# Patient Record
Sex: Female | Born: 1955 | Race: White | Hispanic: No | Smoking: Never smoker
Health system: Southern US, Community
[De-identification: ages and names within clinical notes are randomized; demographics above are authoritative.]

---

## 2014-01-25 ENCOUNTER — Emergency Department
Admission: EM | Admit: 2014-01-25 | Discharge: 2014-01-25 | Disposition: A | Discharge status: Home / Self Care | Payer: BC Managed Care – PPO | Source: Home / Self Care

## 2014-01-25 ENCOUNTER — Emergency Department (INDEPENDENT_AMBULATORY_CARE_PROVIDER_SITE_OTHER): Payer: BC Managed Care – PPO

## 2014-01-25 DIAGNOSIS — M549 Dorsalgia, unspecified: Secondary | ICD-10-CM

## 2014-01-25 MED ORDER — MELOXICAM 15 MG PO TABS: 15.0000 mg | ORAL_TABLET | Freq: Every day | ORAL | Status: AC

## 2014-01-25 MED ORDER — CYCLOBENZAPRINE HCL 10 MG PO TABS: 10.0000 mg | ORAL_TABLET | Freq: Three times a day (TID) | ORAL | Status: AC | PRN

## 2014-01-25 MED ORDER — METHYLPREDNISOLONE SODIUM SUCC 125 MG IJ SOLR
125.0000 mg | Freq: Once | INTRAMUSCULAR | Status: AC
  Administered 2014-01-25: 125 mg via INTRAMUSCULAR

## 2014-01-25 NOTE — ED Provider Notes (Signed)
CSN: [id number]     Arrival date & time 01/25/14  1218 History   None    No chief complaint on file.   HPI The patient presents today with back pain. Location: R mid/lower back  Timing: 1-2 weeks.  Description: R back pain. Had strain from back 1-2 weeks ago. Was doing cleaning and had strain recurrence  Worse with: bending, prolonged sitting  Better with: rest, standing  Trauma: no Bladder/bowel incontinence: no Weakness: no Fever/chills: no Night pain:no Unexplained weight loss: no Cancer/immunosuppression: no PMH of osteoporosis or chronic steroid use:  no No past medical history on file. No past surgical history on file. No family history on file. History  Substance Use Topics  . Smoking status: Not on file  . Smokeless tobacco: Not on file  . Alcohol Use: Not on file   OB History    No data available     Review of Systems  All other systems reviewed and are negative.   Allergies  Review of patient's allergies indicates not on file.  Home Medications   Prior to Admission medications   Medication Sig Start Date End Date Taking? Authorizing Provider  cyclobenzaprine (FLEXERIL) 10 MG tablet Take 1 tablet (10 mg total) by mouth 3 (three) times daily as needed for muscle spasms. 01/25/14   Doree AlbeeSteven Norah Fick, MD  meloxicam (MOBIC) 15 MG tablet Take 1 tablet (15 mg total) by mouth daily. 01/25/14   Doree AlbeeSteven Lavanna Rog, MD   There were no vitals taken for this visit. Physical Exam  Constitutional: She appears well-developed and well-nourished.  HENT:  Head: Normocephalic and atraumatic.  Eyes: Pupils are equal, round, and reactive to light.  Neck: Normal range of motion.  Cardiovascular: Normal rate and regular rhythm.   Pulmonary/Chest: Effort normal.  Abdominal: Soft.  Musculoskeletal:       Arms: + pain with flexion/extension over affected area      ED Course  Procedures (including critical care time) Labs Review Labs Reviewed - No data to display  Imaging  Review Dg Ribs Unilateral W/chest Left  01/25/2014   CLINICAL DATA:  Back pain.  EXAM: LEFT RIBS AND CHEST - 3+ VIEW  COMPARISON:  None.  FINDINGS: Note that dedicated rib images were obtained of the left lower ribs with a marker placed below the costal margin.No fracture or other bone lesions are seen involving the ribs. Normal heart size and mediastinal contours. No acute infiltrate or edema. No effusion or pneumothorax.  IMPRESSION: Negative.   Electronically Signed   By: Tiburcio PeaJonathan  Watts M.D.   On: 01/25/2014 13:28     MDM   1. Back pain    Likely MSK strain Solumedrol 125mg  IM x1 Rx for mobic and flexeril RICE and NSAIDs  Discussed general care and MSK red flags Follow up as needed.     The patient and/or caregiver has been counseled thoroughly with regard to treatment plan and/or medications prescribed including dosage, schedule, interactions, rationale for use, and possible side effects and they verbalize understanding. Diagnoses and expected course of recovery discussed and will return if not improved as expected or if the condition worsens. Patient and/or caregiver verbalized understanding.         Doree AlbeeSteven Chares Slaymaker, MD 01/25/14 [phone]

## 2016-04-02 IMAGING — CR DG RIBS W/ CHEST 3+V*L*
3 series · 3 of 3 positions shown · non-contrast
Comparison: None.

CLINICAL DATA: Back pain.

EXAM:
LEFT RIBS AND CHEST - 3+ VIEW

[view not recorded (1 of 3)]
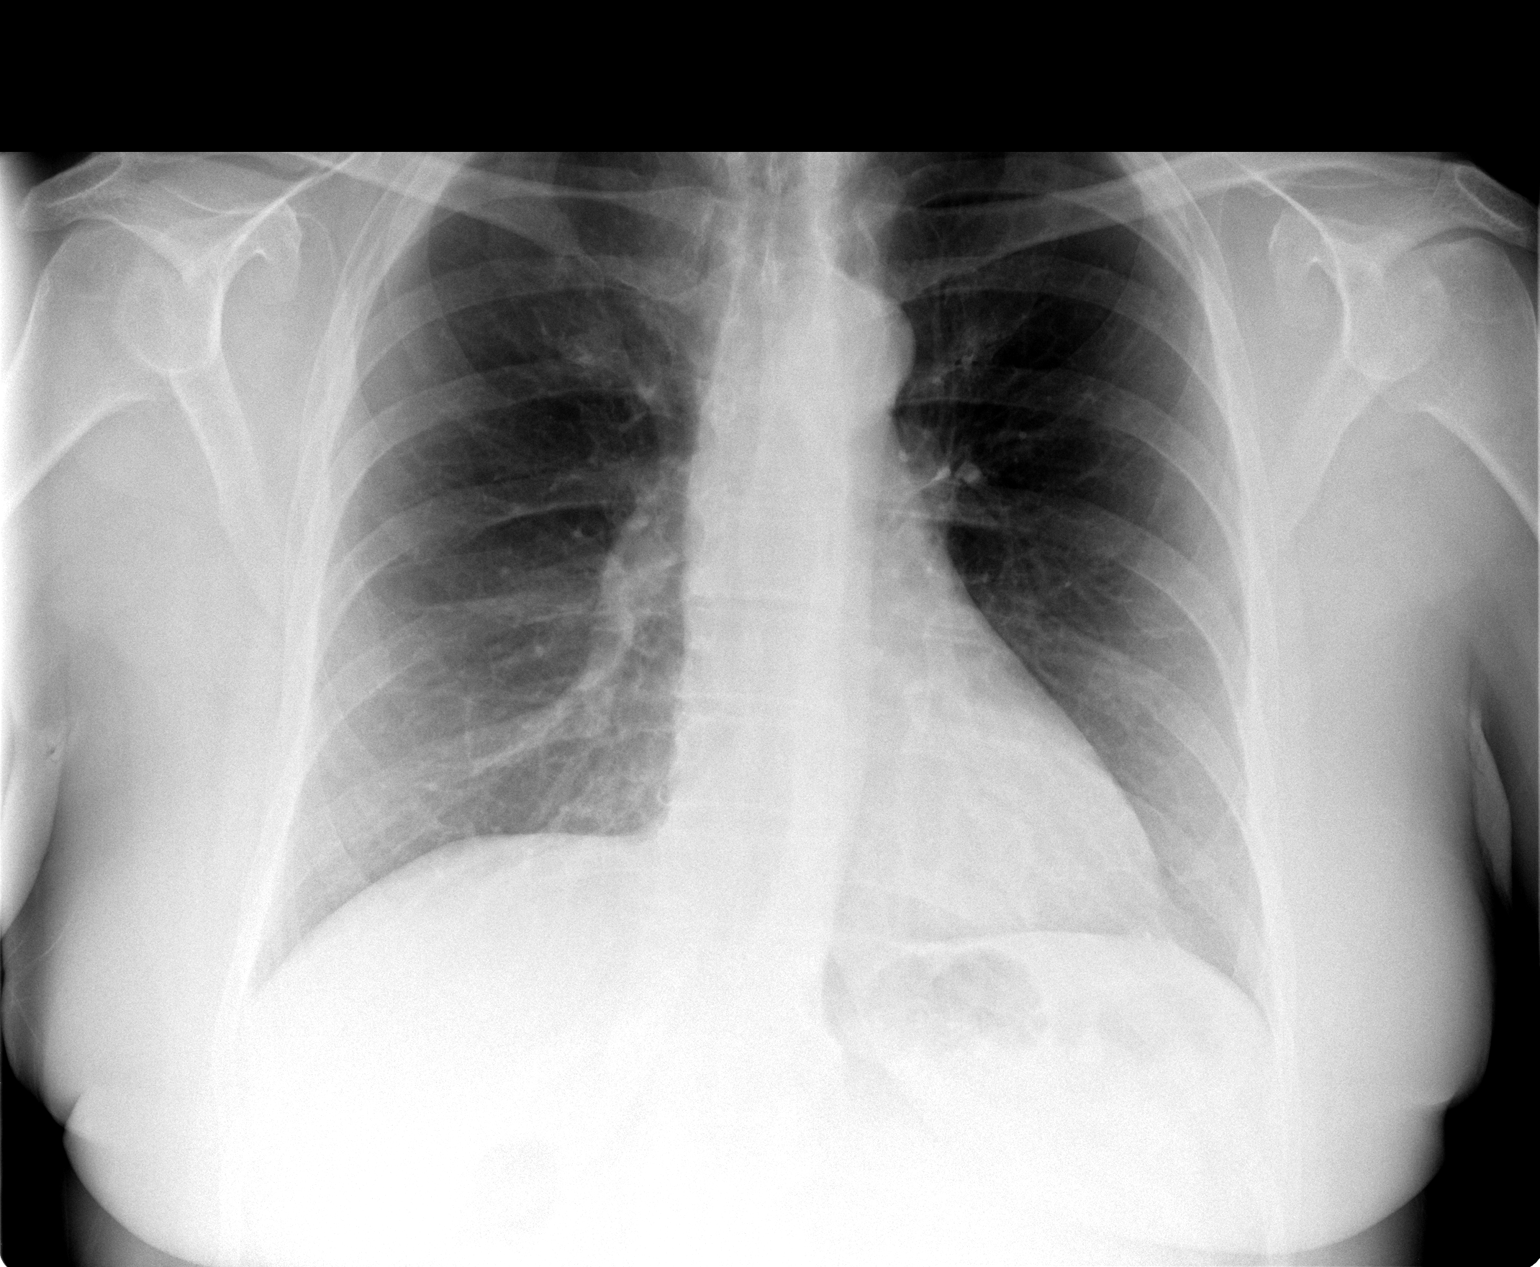

[view not recorded (2 of 3)]
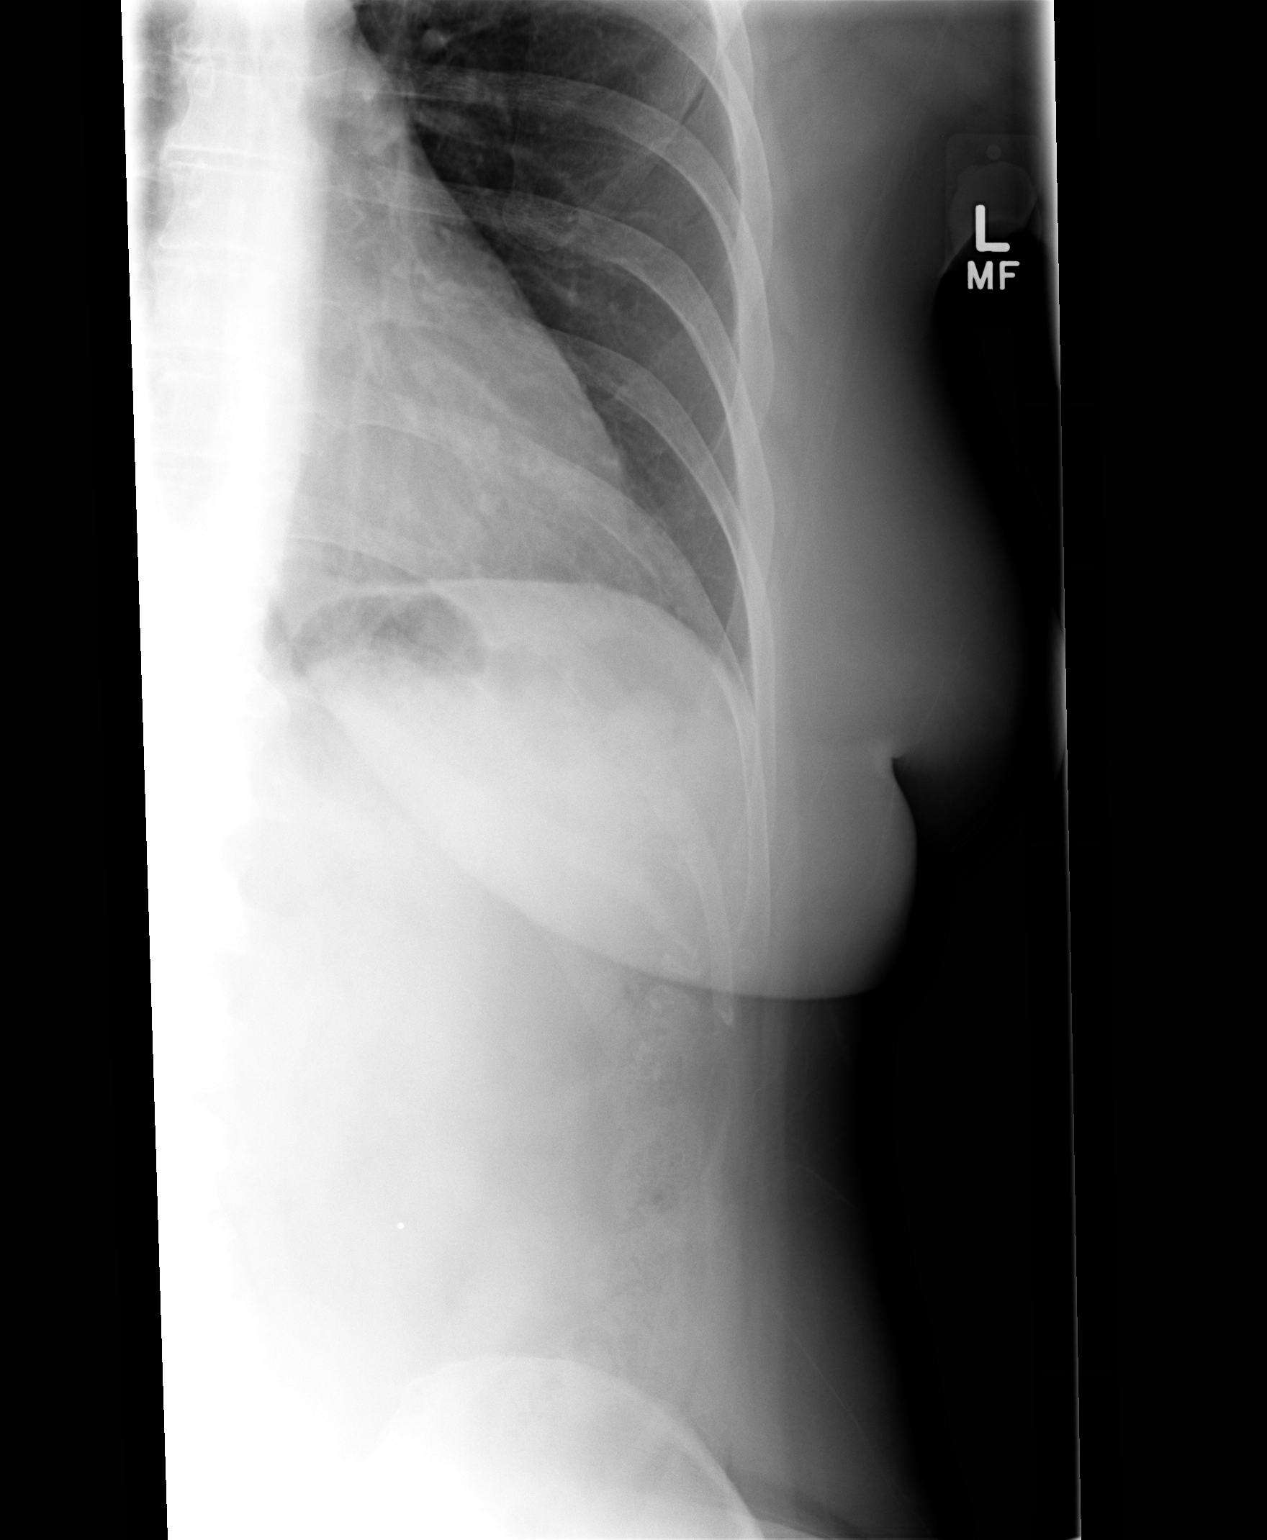

[view not recorded (3 of 3)]
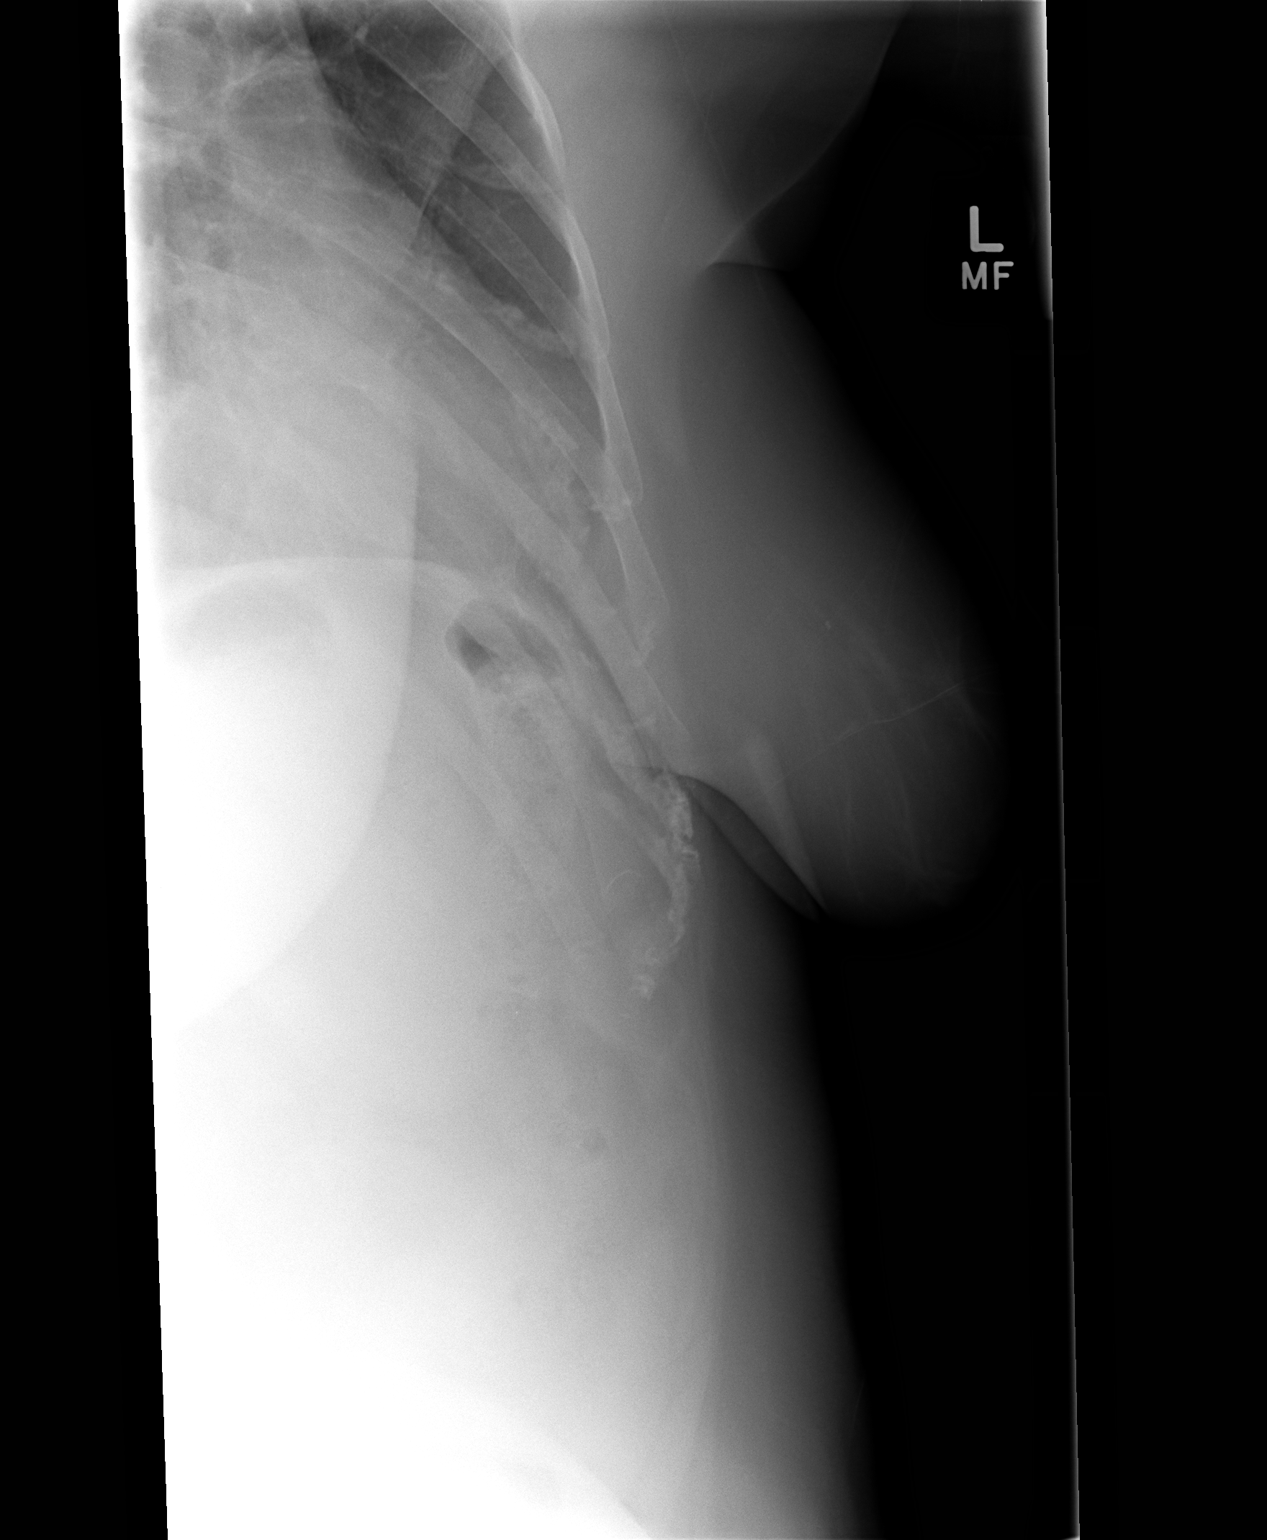

[3 of 3 positions shown; findings below may reference images not displayed]

FINDINGS: Note that dedicated rib images were obtained of the left lower ribs
with a marker placed below the costal margin.No fracture or other
bone lesions are seen involving the ribs. Normal heart size and
mediastinal contours. No acute infiltrate or edema. No effusion or
pneumothorax.
IMPRESSION: Negative.

## 2016-08-21 ENCOUNTER — Emergency Department
Admission: EM | Admit: 2016-08-21 | Discharge: 2016-08-21 | Disposition: A | Discharge status: Home / Self Care | Payer: BLUE CROSS/BLUE SHIELD | Source: Home / Self Care | Attending: Family Medicine | Admitting: Family Medicine

## 2016-08-21 ENCOUNTER — Encounter: Payer: Self-pay | Admitting: Emergency Medicine

## 2016-08-21 DIAGNOSIS — R6881 Early satiety: Secondary | ICD-10-CM

## 2016-08-21 DIAGNOSIS — R109 Unspecified abdominal pain: Secondary | ICD-10-CM

## 2016-08-21 DIAGNOSIS — R110 Nausea: Secondary | ICD-10-CM | POA: Diagnosis not present

## 2016-08-21 LAB — POCT CBC W AUTO DIFF (K'VILLE URGENT CARE)

## 2016-08-21 MED ORDER — ONDANSETRON 4 MG PO TBDP
4.0000 mg | ORAL_TABLET | Freq: Once | ORAL | Status: AC
  Administered 2016-08-21: 4 mg via ORAL

## 2016-08-21 MED ORDER — PROMETHAZINE HCL 25 MG PO TABS: 25.0000 mg | ORAL_TABLET | Freq: Four times a day (QID) | ORAL | 0 refills | Status: AC | PRN

## 2016-08-21 NOTE — ED Triage Notes (Signed)
[facility] since 3 am. Denies vomiting, diarrhea times two yesterday, she states that she had had thin intermittently since March. New episode started today.

## 2016-08-21 NOTE — ED Provider Notes (Signed)
CSN: [id number]     Arrival date & time 08/21/16  1608 History   First MD Initiated Contact with Patient 08/21/16 1654     Chief Complaint  Patient presents with  . Nausea   (Consider location/radiation/quality/duration/timing/severity/associated sxs/prior Treatment) HPI Shelia Villarreal is a 61 y.o. female presenting to UC with c/o intermittent nausea for about 2-3 months that has worsened since 3AM this morning.  Associated midabdominal cramping. No vomiting.  She has had intermittent diarrhea with 2 episodes yesterday. No blood in stool. Denies fever or chills. She has not f/u with her PCP about symptoms but was planning to soon until symptoms became unbearable this morning.  She reports early satiety for the last few weeks.  She can only eat 1-2 spoonfuls of food at a time. No specific food seems to make symptoms better or worse. Denies acid reflux. No radiation to her chest or back. No urinary symptoms. No hx of abdominal surgeries.  Father died of pancreatic cancer. No other family hx of stomach cancers.    History reviewed. No pertinent past medical history. History reviewed. No pertinent surgical history. History reviewed. No pertinent family history. Social History  Substance Use Topics  . Smoking status: Never Smoker  . Smokeless tobacco: Never Used  . Alcohol use No   OB History    No data available     Review of Systems  Constitutional: Negative.  Negative for chills, diaphoresis and fever.  Respiratory: Negative for chest tightness and shortness of breath.   Cardiovascular: Negative for chest pain.  Gastrointestinal: Positive for abdominal pain (mid ), diarrhea and nausea. Negative for abdominal distention and vomiting.  Genitourinary: Negative for dysuria, flank pain and hematuria.  Neurological: Negative for dizziness, weakness and light-headedness.    Allergies  Septra [sulfamethoxazole-trimethoprim]  Home Medications   Prior to Admission medications   Medication  Sig Start Date End Date Taking? Authorizing Provider  cyclobenzaprine (FLEXERIL) 10 MG tablet Take 1 tablet (10 mg total) by mouth 3 (three) times daily as needed for muscle spasms. 01/25/14   Floydene FlockNewton, Steven J, MD  meloxicam (MOBIC) 15 MG tablet Take 1 tablet (15 mg total) by mouth daily. 01/25/14   Floydene FlockNewton, Steven J, MD  promethazine (PHENERGAN) 25 MG tablet Take 1 tablet (25 mg total) by mouth every 6 (six) hours as needed. 08/21/16   Junius Finner'Malley, Bennet Kujawa, PA-C   Meds Ordered and Administered this Visit   Medications  ondansetron (ZOFRAN-ODT) disintegrating tablet 4 mg (4 mg Oral Given 08/21/16 1700)    BP (!) 166/108 (BP Location: Left Arm)   Pulse 90   Temp 98.8 F (37.1 C) (Oral)   Resp 16   Ht 5\' 8"  (1.727 m)   Wt 193 lb 4 oz (87.7 kg)   SpO2 96%   BMI 29.38 kg/m  No data found.   Physical Exam  Constitutional: She is oriented to person, place, and time. She appears well-developed and well-nourished. No distress.  HENT:  Head: Normocephalic and atraumatic.  Mouth/Throat: Oropharynx is clear and moist.  Eyes: EOM are normal.  Neck: Normal range of motion.  Cardiovascular: Normal rate and regular rhythm.   Pulmonary/Chest: Effort normal and breath sounds normal. No respiratory distress. She has no wheezes. She has no rales.  Abdominal: Soft. Bowel sounds are normal. She exhibits no distension and no mass. There is tenderness. There is no rebound, no guarding and no CVA tenderness. No hernia.    Musculoskeletal: Normal range of motion.  Neurological: She is alert and  oriented to person, place, and time.  Skin: Skin is warm and dry. She is not diaphoretic.  Psychiatric: She has a normal mood and affect. Her behavior is normal.  Nursing note and vitals reviewed.   Urgent Care Course     Procedures (including critical care time)  Labs Review Labs Reviewed  COMPLETE METABOLIC PANEL WITH GFR  LIPASE  POCT CBC W AUTO DIFF ([facility])    Imaging Review No results  found.   MDM   1. Nausea   2. Abdominal cramping   3. Early satiety    Pt c/o 2-3 months of intermittent abdominal pain and nausea with early satiety. Pt does have tenderness to Right side of abdomen, however, symptoms have been intermittent for several months, pt is afebrile and CBC- WNL Low concern for appendicitis at this time, however, discussed signs/symptoms to go to emergency department for CT scan  CMP and Lipase- pending  Encouraged f/u with PCP for additional workup of chronic symptoms.      Junius Finner, PA-C 08/21/16 1757

## 2016-08-22 ENCOUNTER — Telehealth: Payer: Self-pay | Admitting: *Deleted

## 2016-08-22 LAB — COMPLETE METABOLIC PANEL WITH GFR
ALT: 25 U/L (ref 6–29)
AST: 28 U/L (ref 10–35)
Albumin: 4.2 g/dL (ref 3.6–5.1)
Alkaline Phosphatase: 81 U/L (ref 33–130)
BUN: 20 mg/dL (ref 7–25)
CO2: 23 mmol/L (ref 20–31)
Calcium: 9.1 mg/dL (ref 8.6–10.4)
Chloride: 107 mmol/L (ref 98–110)
Creat: 1.05 mg/dL — ABNORMAL HIGH (ref 0.50–0.99)
GFR, Est African American: 67 mL/min (ref >=60)
GFR, Est Non African American: 58 mL/min — ABNORMAL LOW (ref >=60)
Glucose, Bld: 103 mg/dL — ABNORMAL HIGH (ref 65–99)
Potassium: 3.8 mmol/L (ref 3.5–5.3)
Sodium: 139 mmol/L (ref 135–146)
Total Bilirubin: 0.4 mg/dL (ref 0.2–1.2)
Total Protein: 7.2 g/dL (ref 6.1–8.1)

## 2016-08-22 LAB — LIPASE: Lipase: 16 U/L (ref 7–60)

## 2016-08-22 NOTE — Telephone Encounter (Signed)
Encounter created to enter CBC Order and result not entered on DOS.
